# Patient Record
Sex: Male | Born: 1975 | Race: White | Hispanic: No | Marital: Single | Smoking: Never smoker
Health system: Southern US, Community
[De-identification: ages and names within clinical notes are randomized; demographics above are authoritative.]

---

## 2019-11-12 ENCOUNTER — Emergency Department (INDEPENDENT_AMBULATORY_CARE_PROVIDER_SITE_OTHER): Payer: BC Managed Care – PPO

## 2019-11-12 ENCOUNTER — Emergency Department (INDEPENDENT_AMBULATORY_CARE_PROVIDER_SITE_OTHER)
Admission: EM | Admit: 2019-11-12 | Discharge: 2019-11-12 | Disposition: A | Discharge status: Home / Self Care | Payer: BC Managed Care – PPO | Source: Home / Self Care | Attending: Family Medicine | Admitting: Family Medicine

## 2019-11-12 ENCOUNTER — Other Ambulatory Visit: Payer: Self-pay

## 2019-11-12 DIAGNOSIS — U071 COVID-19: Secondary | ICD-10-CM

## 2019-11-12 DIAGNOSIS — R110 Nausea: Secondary | ICD-10-CM

## 2019-11-12 DIAGNOSIS — M25551 Pain in right hip: Secondary | ICD-10-CM | POA: Diagnosis not present

## 2019-11-12 MED ORDER — ONDANSETRON 4 MG PO TBDP: ORAL_TABLET | ORAL | 0 refills | Status: AC

## 2019-11-12 MED ORDER — PREDNISONE 20 MG PO TABS: ORAL_TABLET | ORAL | 0 refills | Status: AC

## 2019-11-12 MED ORDER — DOXYCYCLINE HYCLATE 100 MG PO CAPS: 100.0000 mg | ORAL_CAPSULE | Freq: Two times a day (BID) | ORAL | 0 refills | Status: AC

## 2019-11-12 NOTE — ED Triage Notes (Signed)
Pt tested pos for COVID on 1/20. Pt c/o shortness of breath and extreme fatigue. Pt says he was vomiting on Mon due to loss of appetite and being dehydrated but that has resolved.

## 2019-11-12 NOTE — ED Provider Notes (Signed)
[facility]    CSN: [id number] Arrival date & time: 11/12/19  1137      History   Chief Complaint Chief Complaint  Patient presents with  . Shortness of Breath  . Fatigue    HPI Joseph Hodge is a 44 y.o. male.   Patient reports that he began developing sinus congestion about two weeks ago, and four days later developed fatigue, sore throat, fever/chills/sweats, and mild cough.  He tested positive for COVID19 ten days ago.  His sore throat has persisted, and six days ago he developed nausea/vomiting.  His vomiting has resolved but he still has nausea at times.  About 5 days ago he began to feel tightness in his anterior chest and developed shortness of breath with activity.  He started checking his SpO2 with a pulse oximeter.  His O2 is usually about 97% during the day but last night dropped to 93%.  He reports that his sense of smell has decreased and he has a bad taste in his mouth. The patient also complains of pain in his right hip.  He had right hip surgery about two years for tendon injury.  He states that while vomiting one week ago, he "threw out" his right hip resulting in persistent pain with movement. He states that he had pneumonia as a child.  The history is provided by the patient.    History reviewed.   Hypothyroid    Sleep apnea    Nicotine addiction       Current problems: Right hip pain 08/11/2016  Low testosterone 08/10/2014  Cigarette nicotine dependence without complication 01/22/2014  Allergic rhinitis, seasonal 01/22/2014  Impaired glucose tolerance 07/19/2012  Erectile dysfunction 01/15/2012  Obstructive sleep apnea 08/07/2011  Acquired hypothyroidism 08/07/2011  Hyperlipidemia 08/07/2011  Obesity (BMI 30.0-34.9)      Surgical history:  Right hip surgery     Home Medications    Prior to Admission medications   Medication Sig Start Date End Date Taking? Authorizing Provider  HYDROcodone-acetaminophen (NORCO) 7.5-325 MG  tablet Take by mouth. 11/24/17  Yes [provider]  levothyroxine (SYNTHROID) 112 MCG tablet  12/01/18  Yes [provider]  metFORMIN (GLUCOPHAGE-XR) 500 MG 24 hr tablet TK 1 T PO AT BEDTIME. INCREASE BY 1 T Q 1-2 WKS UNTIL TAKING 3 TS Q NIGHT 06/10/18  Yes [provider]  doxycycline (VIBRAMYCIN) 100 MG capsule Take 1 capsule (100 mg total) by mouth 2 (two) times daily. Take with food. 11/12/19   Lattie Haw, MD  ondansetron (ZOFRAN ODT) 4 MG disintegrating tablet Take one tab by mouth Q6hr prn nausea.  Dissolve under tongue. 11/12/19   Lattie Haw, MD  predniSONE (DELTASONE) 20 MG tablet Take one tab by mouth twice daily for 4 days, then one daily. Take with food. 11/12/19   Lattie Haw, MD    Family History History reviewed.  Diabetes Brother    Diabetes Father    Hypothyroidism Father    Heart disease        Social History Social History   Tobacco Use  . Smoking status: Never Smoker  . Smokeless tobacco: Never Used  Substance Use Topics  . Alcohol use: Not on file  . Drug use: Not on file     Allergies   Patient has no known allergies.   Review of Systems Review of Systems + sore throat + mild cough No pleuritic pain but feels tight in his anterior chest No wheezing + nasal congestion +  post-nasal drainage No sinus pain/pressure No itchy/red eyes No earache No hemoptysis + SOB with activity + fever, + chills/sweats + nausea + vomiting No abdominal pain No diarrhea No urinary symptoms No skin rash + fatigue + myalgias No headache   Physical Exam Triage Vital Signs ED Triage Vitals [11/12/19 1221]  Enc Vitals Group     BP 124/84     Pulse Rate 95     Resp 18     Temp 98.1 F (36.7 C)     Temp Source Oral     SpO2 94 %     Weight      Height      Head Circumference      Peak Flow      Pain Score 1     Pain Loc      Pain Edu?      Excl. in GC?    No data found.  Updated Vital Signs BP 124/84  (BP Location: Left Arm)   Pulse 95   Temp 98.1 F (36.7 C) (Oral)   Resp 18   SpO2 94%   Visual Acuity Right Eye Distance:   Left Eye Distance:   Bilateral Distance:    Right Eye Near:   Left Eye Near:    Bilateral Near:     Physical Exam Nursing notes and Vital Signs reviewed. Appearance:  Patient appears stated age, and in no acute distress Eyes:  Pupils are equal, round, and reactive to light and accomodation.  Extraocular movement is intact.  Conjunctivae are not inflamed  Ears:  Canals normal.  Tympanic membranes normal.  Nose:  Mildly congested turbinates.  No sinus tenderness.  Pharynx:  Normal Neck:  Supple.  Mildly enlarged lateral nodes are present, tender to palpation on the left.   Lungs:  Clear to auscultation.  Breath sounds are equal.  Moving air well. Heart:  Regular rate and rhythm without murmurs, rubs, or gallops.  Abdomen:  Nontender without masses or hepatosplenomegaly.  Bowel sounds are present.  No CVA or flank tenderness.  Extremities:  No edema.  Right hip:  Vague tenderness to palpation over greater trochanter.  Decreased active/passive flexion and internal/external rotation. Skin:  No rash present.   UC Treatments / Results  Labs (all labs ordered are listed, but only abnormal results are displayed) Labs Reviewed - No data to display  EKG   Radiology DG Chest 2 View  Result Date: 11/12/2019 CLINICAL DATA:  The patient tested positive for COVID-19 10 days ago. Increasing cough. EXAM: CHEST - 2 VIEW COMPARISON:  None. FINDINGS: The heart size and mediastinal contours are within normal limits. Both lungs are clear. The visualized skeletal structures are unremarkable. IMPRESSION: No active cardiopulmonary disease. Electronically Signed   By: Gerome Sam III M.D   On: 11/12/2019 13:56   DG Hip Unilat W or Wo Pelvis 2-3 Views Right  Result Date: 11/12/2019 CLINICAL DATA:  Pain. EXAM: DG HIP (WITH OR WITHOUT PELVIS) 2-3V RIGHT COMPARISON:  None.  FINDINGS: There is no evidence of hip fracture or dislocation. There is no evidence of arthropathy or other focal bone abnormality. IMPRESSION: Negative. Electronically Signed   By: Gerome Sam III M.D   On: 11/12/2019 13:58    Procedures Procedures (including critical care time)  Medications Ordered in UC Medications - No data to display  Initial Impression / Assessment and Plan / UC Course  I have reviewed the triage vital signs and the nursing notes.  Pertinent labs & imaging  results that were available during my care of the patient were reviewed by me and considered in my medical decision making (see chart for details).    Negative chest x-ray reassuring. Begin prednisone burst/taper, and empiric doxycycline. Followup with Family Doctor in about 6 days.     Final Clinical Impressions(s) / UC Diagnoses   Final diagnoses:  [id number] virus infection  Acute right hip pain  Nausea without vomiting     Discharge Instructions     Take plain guaifenesin (1200mg  extended release tabs such as Mucinex) twice daily, with plenty of water, for cough and congestion.  Get adequate rest.   May take Delsym Cough Suppressant at bedtime for nighttime cough.  Try warm salt water gargles for sore throat.  Stop all antihistamines for now, and other non-prescription cough/cold preparations. May take Tylenol as needed for headache, fever, etc.    ED Prescriptions    Medication Sig Dispense Auth. Provider   predniSONE (DELTASONE) 20 MG tablet Take one tab by mouth twice daily for 4 days, then one daily. Take with food. 12 tablet Kandra Nicolas, MD   doxycycline (VIBRAMYCIN) 100 MG capsule Take 1 capsule (100 mg total) by mouth 2 (two) times daily. Take with food. 14 capsule Kandra Nicolas, MD   ondansetron (ZOFRAN ODT) 4 MG disintegrating tablet Take one tab by mouth Q6hr prn nausea.  Dissolve under tongue. 12 tablet Kandra Nicolas, MD        Kandra Nicolas, MD 11/15/19  [phone]

## 2019-11-12 NOTE — Discharge Instructions (Addendum)
Take plain guaifenesin (1200mg  extended release tabs such as Mucinex) twice daily, with plenty of water, for cough and congestion.  Get adequate rest.   May take Delsym Cough Suppressant at bedtime for nighttime cough.  Try warm salt water gargles for sore throat.  Stop all antihistamines for now, and other non-prescription cough/cold preparations. May take Tylenol as needed for headache, fever, etc.

## 2021-03-31 IMAGING — DX DG HIP (WITH OR WITHOUT PELVIS) 2-3V*R*
3 series · 3 of 3 positions shown · non-contrast
Comparison: None.

CLINICAL DATA: Pain.

EXAM:
DG HIP (WITH OR WITHOUT PELVIS) 2-3V RIGHT

[pelvis ap]
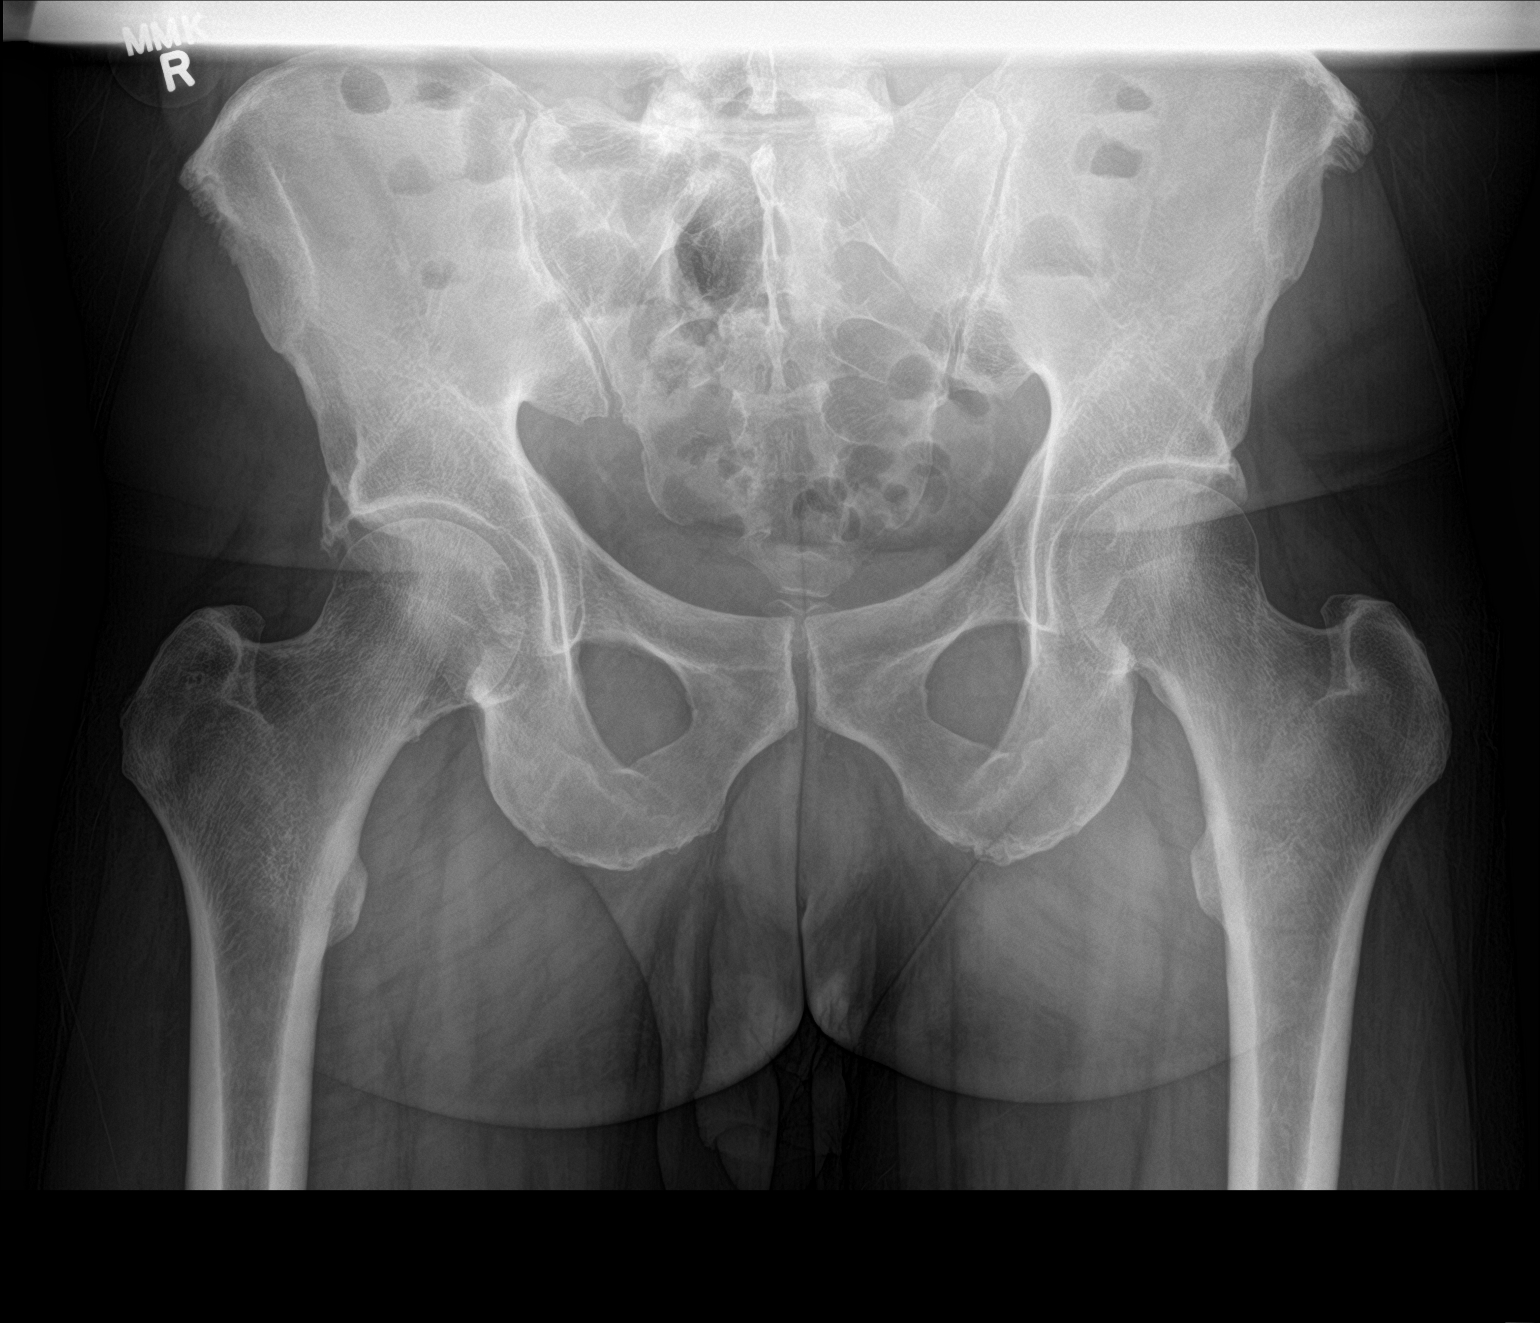

[hip ap]
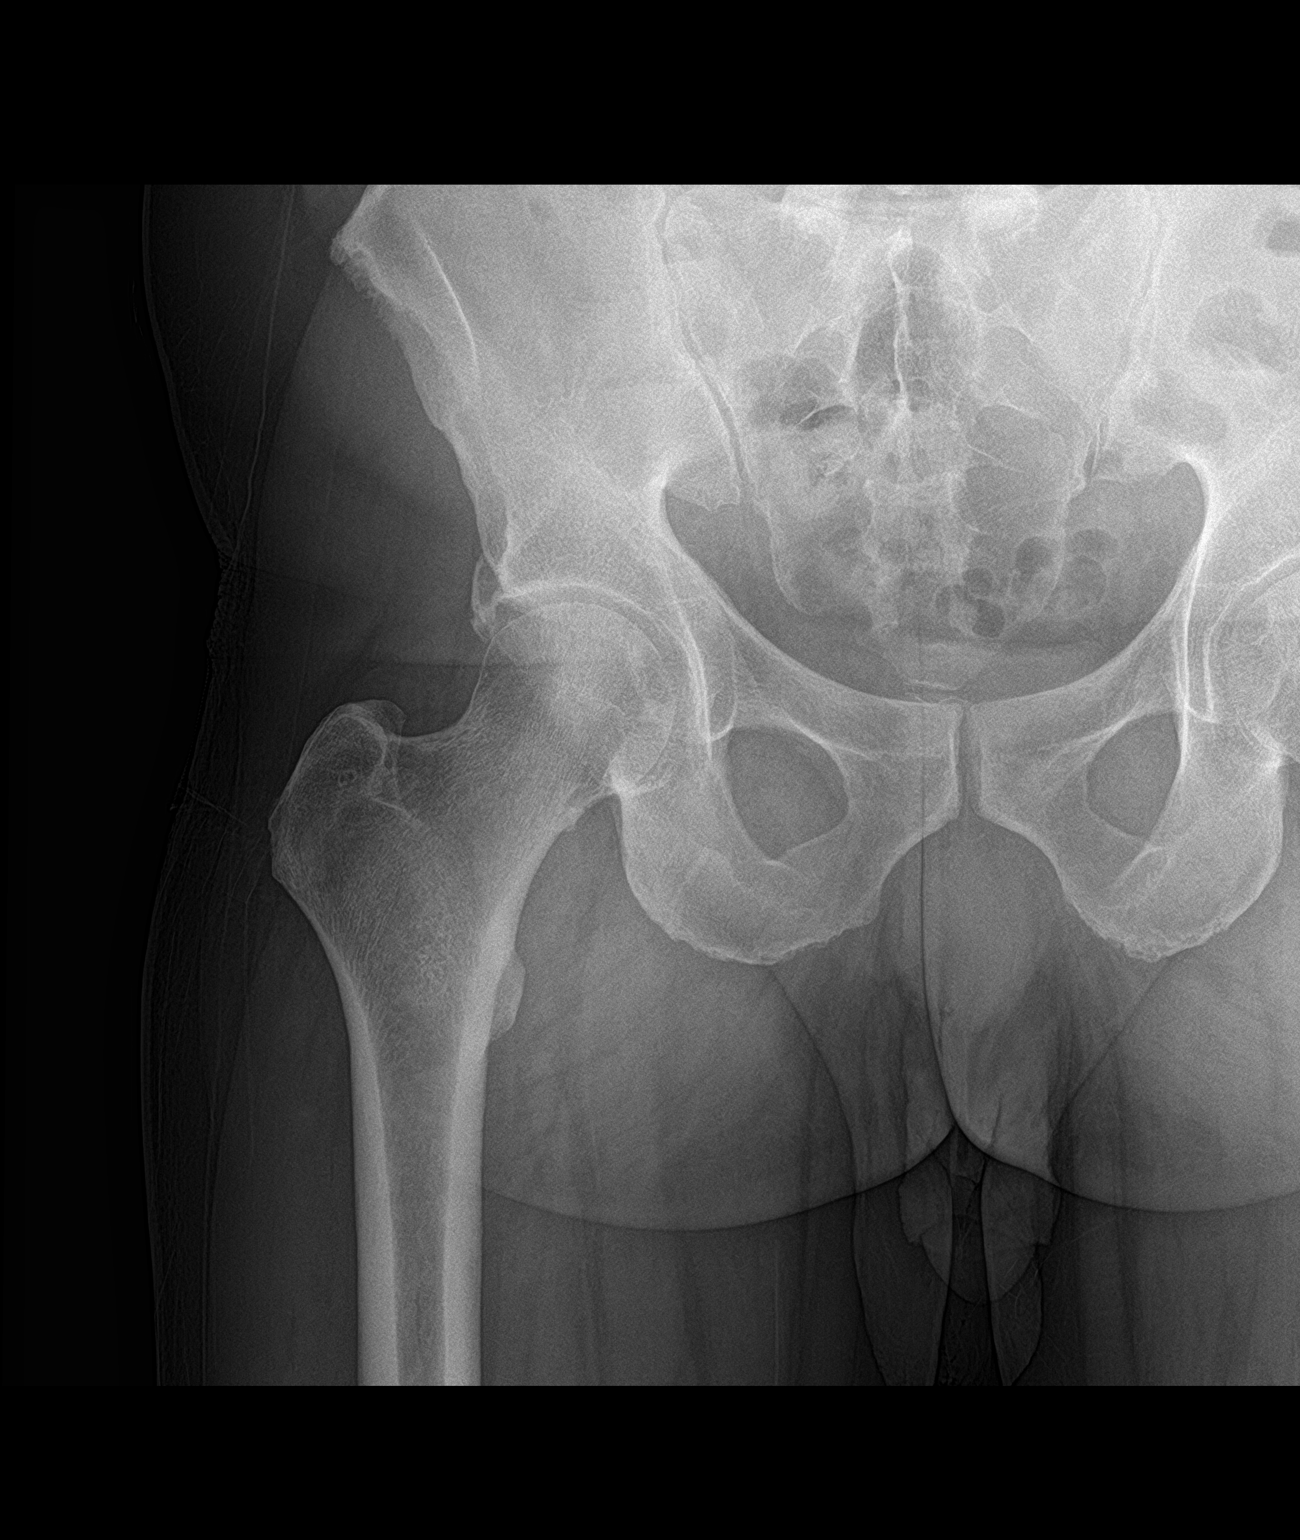

[hip frog leg]
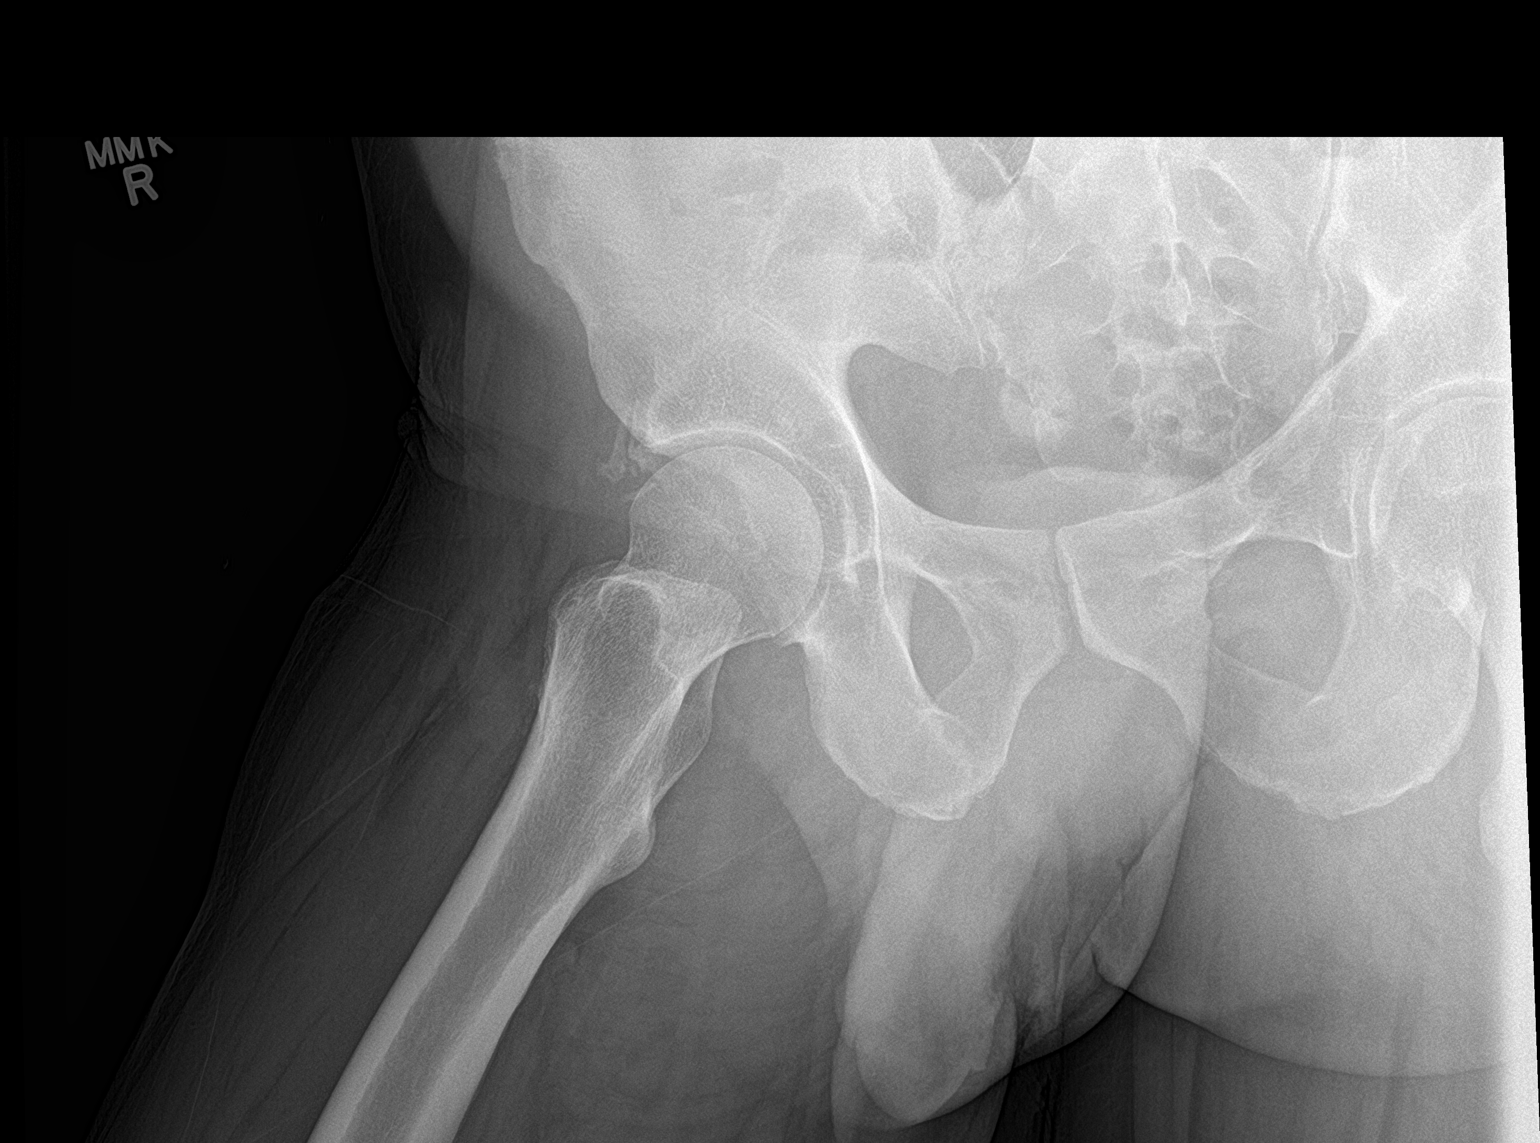

[3 of 3 positions shown; findings below may reference images not displayed]

FINDINGS: There is no evidence of hip fracture or dislocation. There is no
evidence of arthropathy or other focal bone abnormality.
IMPRESSION: Negative.
